# Patient Record
Sex: Female | Born: 1975 | Race: Black or African American | Hispanic: No | Marital: Single | Smoking: Never smoker
Health system: Southern US, Community
[De-identification: ages and names within clinical notes are randomized; demographics above are authoritative.]

## PROBLEM LIST (undated history)

## (undated) DIAGNOSIS — Z789 Other specified health status: Secondary | ICD-10-CM

---

## 2008-01-31 ENCOUNTER — Ambulatory Visit: Payer: Self-pay | Admitting: Family Medicine

## 2008-09-08 ENCOUNTER — Ambulatory Visit: Payer: Self-pay | Admitting: Family Medicine

## 2009-07-14 ENCOUNTER — Ambulatory Visit: Payer: Self-pay | Admitting: Podiatry

## 2015-09-15 ENCOUNTER — Ambulatory Visit
Admission: EM | Admit: 2015-09-15 | Discharge: 2015-09-15 | Disposition: A | Discharge status: Home / Self Care | Payer: BC Managed Care – PPO | Attending: Family Medicine | Admitting: Family Medicine

## 2015-09-15 ENCOUNTER — Encounter: Payer: Self-pay | Admitting: Emergency Medicine

## 2015-09-15 DIAGNOSIS — B998 Other infectious disease: Secondary | ICD-10-CM | POA: Diagnosis not present

## 2015-09-15 DIAGNOSIS — L089 Local infection of the skin and subcutaneous tissue, unspecified: Secondary | ICD-10-CM

## 2015-09-15 MED ORDER — MUPIROCIN 2 % EX OINT: 1.0000 "application " | TOPICAL_OINTMENT | Freq: Three times a day (TID) | CUTANEOUS | Status: DC

## 2015-09-15 MED ORDER — SULFAMETHOXAZOLE-TRIMETHOPRIM 800-160 MG PO TABS: 1.0000 | ORAL_TABLET | Freq: Two times a day (BID) | ORAL | Status: DC

## 2015-09-15 NOTE — ED Notes (Signed)
Pt with swelling in face x 2 days

## 2015-09-15 NOTE — ED Provider Notes (Signed)
CSN: [id number]     Arrival date & time 09/15/15  1742 History   First MD Initiated Contact with Patient 09/15/15 1822     Chief Complaint  Patient presents with  . Facial Pain   (Consider location/radiation/quality/duration/timing/severity/associated sxs/prior Treatment) HPI   39 year old female who is accompanied by her husband playing a swelling on the left side of her nose for 2 days. She states that she had a pimple in her nose shortly preceding the swelling. Now the nose has become very red and tender and hard to the touch. She also complains of a sore throat mostly from postnasal drip. She's had no drainage from her nose. She denies any fever or chills and is afebrile today.  History reviewed. No pertinent past medical history. History reviewed. No pertinent past surgical history. History reviewed. No pertinent family history. Social History  Substance Use Topics  . Smoking status: Never Smoker   . Smokeless tobacco: None  . Alcohol Use: No   OB History    No data available     Review of Systems  Constitutional: Negative for fever, chills and fatigue.  HENT: Positive for postnasal drip and sinus pressure.   Skin: Positive for color change.  All other systems reviewed and are negative.   Allergies  Review of patient's allergies indicates no known allergies.  Home Medications   Prior to Admission medications   Medication Sig Start Date End Date Taking? Authorizing Provider  mupirocin ointment (BACTROBAN) 2 % Apply 1 application topically 3 (three) times daily. 09/15/15   Lutricia FeilWilliam P Bryttney Netzer, PA-C  sulfamethoxazole-trimethoprim (BACTRIM DS,SEPTRA DS) 800-160 MG tablet Take 1 tablet by mouth 2 (two) times daily. 09/15/15   Lutricia FeilWilliam P Xariah Silvernail, PA-C   Meds Ordered and Administered this Visit  Medications - No data to display  BP 112/84 mmHg  Pulse 90  Temp(Src) 98.8 F (37.1 C) (Tympanic)  Resp 20  Ht 5\' 8"  (1.727 m)  Wt 220 lb (99.791 kg)  BMI 33.46 kg/m2  SpO2  99% No data found.   Physical Exam  Constitutional: She is oriented to person, place, and time. She appears well-developed and well-nourished. No distress.  HENT:  Head: Normocephalic and atraumatic.  Right Ear: External ear normal.  Left Ear: External ear normal.  Examination of the patient's face shows a swelling erythema induration and tenderness in from the bridge of the nose over the left labial and extending slightly into the face. Examination intranasally shows a swelling and erythema of the inferior turbinates. There is no discharge from the nares. There is no cervical adenopathy present.  Eyes: Pupils are equal, round, and reactive to light.  Neck: Neck supple.  Musculoskeletal: Normal range of motion. She exhibits no edema or tenderness.  Neurological: She is alert and oriented to person, place, and time.  Skin: Skin is warm and dry. She is not diaphoretic. There is erythema.  Psychiatric: She has a normal mood and affect. Her behavior is normal. Judgment and thought content normal.  Nursing note and vitals reviewed.   ED Course  Procedures (including critical care time)  Labs Review Labs Reviewed - No data to display  Imaging Review No results found.   Visual Acuity Review  Right Eye Distance:   Left Eye Distance:   Bilateral Distance:    Right Eye Near:   Left Eye Near:    Bilateral Near:         MDM   1. Facial infection    Discharge Medication List as  of 09/15/2015  6:43 PM    START taking these medications   Details  mupirocin ointment (BACTROBAN) 2 % Apply 1 application topically 3 (three) times daily., Starting 09/15/2015, Until Discontinued, Print    sulfamethoxazole-trimethoprim (BACTRIM DS,SEPTRA DS) 800-160 MG tablet Take 1 tablet by mouth 2 (two) times daily., Starting 09/15/2015, Until Discontinued, Print       Plan: 1.Diagnosis reviewed with patient 2. rx as per orders; risks, benefits, potential side effects reviewed with  patient 3. Recommend supportive treatment with warm compresses QID followed by application of bactroban UGT 4. F/u  48 hours for  Wound check  Lutricia Feil, PA-C 09/16/15 1029

## 2015-09-15 NOTE — Discharge Instructions (Signed)
Hand Washing Germs like bacteria, viruses, and parasites are found everywhere. They can be in the air and water, and they can be on surfaces like food, door handles, and your skin. Every day, your hands come into contact with germs, many of which can make you and your family sick. Washing your hands is one of the easiest and most effective ways to reduce your risk of contracting and sharing germs. WHEN SHOULD I WASH MY HANDS? You should wash your hands whenever you think they are dirty. You should also wash your hands:  Before:  Visiting a baby or anyone with a weakened or lowered defense (immune) system.  Putting in and taking out any contact lenses.  After:  Working or playing outside.  Touching an animal or its toys or leash.  Handling livestock.  Using the bathroom.  Using household cleaners or toxic chemicals.  Touching or taking out the garbage.  Touching anything dirty around your home.  Handling soiled clothes or rags.  Taking care of a sick child. This includes touching used tissues, toys, and clothes.  Sneezing, coughing, or blowing your nose.  Using public transportation.  Shaking hands.  Using a phone, including your mobile phone.  Touching money.  Before and after:  Preparing food.  Preparing a bottle for a baby.  Feeding a baby or young child.  Eating.  Visiting or taking care of someone who is sick.  Changing a diaper.  Changing a bandage (dressing) or taking care of an injury or wound.  Giving or taking medicine. WHAT IS THE CORRECT WAY TO [facility] MY [facility]?   Wet your hands with clean, running water.  Apply liquid soap or bar soap to your hands.  Rub your hands together quickly to create lather.  Keep rubbing your hands together for at least 20 seconds. Thoroughly scrub all parts of your hands, including under your fingernails and between your fingers.  Rinse your hands with clean, running water until all the soap is gone.  Dry your  hands using an air dryer or a clean paper or cloth towel, or let your hands air-dry. Do not use your clothing or a soiled towel to dry your hands.  If you are in a public restroom, use your towel:  To turn off the water faucet.  To open the bathroom door. HOW CAN I CLEAN MY HANDS IF I DO NOT HAVE SOAP AND WATER? If soap and clean water are not available, use an alcohol-based wipe, spray, or hand gel. Use a hand-sanitizing agent that contains at least 60% alcohol. If you are preparing food, hand sanitizers are not recommended as a substitute for hand washing. To use a hand sanitizer, follow the directions provided on the product, and:  Apply an adequate amount of the product to your hands.  Make sure you wipe, rub, or spray the product so that it reaches every part of your hands and wrists. Include the backs of your hands, between your fingers, and under your fingernails.  Rub the product onto your hands until it dries.   This information is not intended to replace advice given to you by your health care provider. Make sure you discuss any questions you have with your health care provider.   Document Released: 07/10/2005 Document Revised: 12/10/2014 Document Reviewed: 04/16/2014 Elsevier Interactive Patient Education [employer]2016 [employer]

## 2015-12-05 ENCOUNTER — Encounter: Payer: Self-pay | Admitting: *Deleted

## 2015-12-05 ENCOUNTER — Ambulatory Visit
Admission: EM | Admit: 2015-12-05 | Discharge: 2015-12-05 | Disposition: A | Discharge status: Home / Self Care | Payer: BC Managed Care – PPO | Attending: Family Medicine | Admitting: Family Medicine

## 2015-12-05 DIAGNOSIS — R05 Cough: Secondary | ICD-10-CM | POA: Diagnosis not present

## 2015-12-05 DIAGNOSIS — H6501 Acute serous otitis media, right ear: Secondary | ICD-10-CM | POA: Diagnosis not present

## 2015-12-05 DIAGNOSIS — R059 Cough, unspecified: Secondary | ICD-10-CM

## 2015-12-05 MED ORDER — BENZONATATE 200 MG PO CAPS
200.0000 mg | ORAL_CAPSULE | Freq: Three times a day (TID) | ORAL | Status: DC | PRN
Start: 2015-12-05 — End: 2018-04-29

## 2015-12-05 MED ORDER — AMOXICILLIN 875 MG PO TABS: 875.0000 mg | ORAL_TABLET | Freq: Two times a day (BID) | ORAL | Status: DC

## 2015-12-05 NOTE — ED Provider Notes (Signed)
CSN: [id number]     Arrival date & time 12/05/15  1000 History   First MD Initiated Contact with Patient 12/05/15 1221     Chief Complaint  Patient presents with  . Nasal Congestion  . Generalized Body Aches  . Otalgia    right side  . Cough   (Consider location/radiation/quality/duration/timing/severity/associated sxs/prior Treatment) Patient is a 40 y.o. female presenting with URI. The history is provided by the patient.  URI Presenting symptoms: congestion, cough, ear pain, fever and rhinorrhea   Severity:  Moderate Onset quality:  Sudden Duration:  14 days Timing:  Constant Progression:  Worsening Chronicity:  New Relieved by:  Nothing Ineffective treatments:  OTC medications Associated symptoms: headaches   Associated symptoms: no neck pain and no wheezing     History reviewed. No pertinent past medical history. History reviewed. No pertinent past surgical history. History reviewed. No pertinent family history. Social History  Substance Use Topics  . Smoking status: Never Smoker   . Smokeless tobacco: Never Used  . Alcohol Use: No   OB History    No data available     Review of Systems  Constitutional: Positive for fever.  HENT: Positive for congestion, ear pain and rhinorrhea.   Respiratory: Positive for cough. Negative for wheezing.   Musculoskeletal: Negative for neck pain.  Neurological: Positive for headaches.    Allergies  Review of patient's allergies indicates no known allergies.  Home Medications   Prior to Admission medications   Medication Sig Start Date End Date Taking? Authorizing Provider  amoxicillin (AMOXIL) 875 MG tablet Take 1 tablet (875 mg total) by mouth 2 (two) times daily. 12/05/15   Payton Mccallum, MD  benzonatate (TESSALON) 200 MG capsule Take 1 capsule (200 mg total) by mouth 3 (three) times daily as needed for cough. 12/05/15   Payton Mccallum, MD  mupirocin ointment (BACTROBAN) 2 % Apply 1 application topically 3 (three) times daily.  09/15/15   Lutricia Feil, PA-C  Sibutramine HCl Monohydrate (MERIDIA PO) Take by mouth.    Historical Provider, MD  sulfamethoxazole-trimethoprim (BACTRIM DS,SEPTRA DS) 800-160 MG tablet Take 1 tablet by mouth 2 (two) times daily. 09/15/15   Lutricia Feil, PA-C   Meds Ordered and Administered this Visit  Medications - No data to display  BP 127/81 mmHg  Pulse 83  Temp(Src) 98.2 F (36.8 C) (Oral)  Resp 20  Ht 5\' 7"  (1.702 m)  Wt 218 lb (98.884 kg)  BMI 34.14 kg/m2  SpO2 100%  LMP 11/30/2015 No data found.   Physical Exam  Constitutional: She appears well-developed and well-nourished. No distress.  HENT:  Head: Normocephalic and atraumatic.  Right Ear: External ear and ear canal normal. Tympanic membrane is erythematous. A middle ear effusion is present.  Left Ear: Tympanic membrane, external ear and ear canal normal.  Nose: Mucosal edema and rhinorrhea present. No nose lacerations, sinus tenderness, nasal deformity, septal deviation or nasal septal hematoma. No epistaxis.  No foreign bodies.  Mouth/Throat: Uvula is midline, oropharynx is clear and moist and mucous membranes are normal. No oropharyngeal exudate.  Eyes: Conjunctivae and EOM are normal. Pupils are equal, round, and reactive to light. Right eye exhibits no discharge. Left eye exhibits no discharge. No scleral icterus.  Neck: Normal range of motion. Neck supple. No thyromegaly present.  Cardiovascular: Normal rate, regular rhythm and normal heart sounds.   Pulmonary/Chest: Effort normal and breath sounds normal. No respiratory distress. She has no wheezes. She has no rales.  Lymphadenopathy:  She has no cervical adenopathy.  Skin: She is not diaphoretic.  Nursing note and vitals reviewed.   ED Course  Procedures (including critical care time)  Labs Review Labs Reviewed - No data to display  Imaging Review No results found.   Visual Acuity Review  Right Eye Distance:   Left Eye Distance:    Bilateral Distance:    Right Eye Near:   Left Eye Near:    Bilateral Near:         MDM   1. Right acute serous otitis media, recurrence not specified   2. Cough    Discharge Medication List as of 12/05/2015  1:04 PM    START taking these medications   Details  amoxicillin (AMOXIL) 875 MG tablet Take 1 tablet (875 mg total) by mouth 2 (two) times daily., Starting 12/05/2015, Until Discontinued, Normal    benzonatate (TESSALON) 200 MG capsule Take 1 capsule (200 mg total) by mouth 3 (three) times daily as needed for cough., Starting 12/05/2015, Until Discontinued, Normal       1. diagnosis reviewed with patient 2. rx as per orders above; reviewed possible side effects, interactions, risks and benefits  3. Recommend supportive treatment with otc analgesics prn 4. Follow-up prn if symptoms worsen or don't improve    Payton Mccallumrlando Jonette Wassel, MD 12/05/15 1406

## 2015-12-05 NOTE — ED Notes (Signed)
Patient started having symptoms of nasal congestion and body aches on 11/22/15. Additional symptoms of cough and right ear pain started 2 days ago.

## 2016-03-28 ENCOUNTER — Ambulatory Visit
Admission: EM | Admit: 2016-03-28 | Discharge: 2016-03-28 | Disposition: A | Discharge status: Home / Self Care | Payer: BC Managed Care – PPO | Attending: Family Medicine | Admitting: Family Medicine

## 2016-03-28 DIAGNOSIS — H109 Unspecified conjunctivitis: Secondary | ICD-10-CM

## 2016-03-28 DIAGNOSIS — L089 Local infection of the skin and subcutaneous tissue, unspecified: Secondary | ICD-10-CM | POA: Diagnosis not present

## 2016-03-28 HISTORY — DX: Other specified health status: Z78.9

## 2016-03-28 MED ORDER — SULFAMETHOXAZOLE-TRIMETHOPRIM 800-160 MG PO TABS: 1.0000 | ORAL_TABLET | Freq: Two times a day (BID) | ORAL | Status: AC

## 2016-03-28 MED ORDER — POLYMYXIN B-TRIMETHOPRIM 10000-0.1 UNIT/ML-% OP SOLN: 1.0000 [drp] | Freq: Four times a day (QID) | OPHTHALMIC | Status: AC

## 2016-03-28 NOTE — ED Provider Notes (Signed)
CSN: [id number]     Arrival date & time 03/28/16  1455 History   First MD Initiated Contact with Patient 03/28/16 1516     Chief Complaint  Patient presents with  . Facial Swelling    Pt with swelling left side of nasal/facial area. Eyes have been watering. Was treated for same back in October with an antibiotic. Denies pain   (Consider location/radiation/quality/duration/timing/severity/associated sxs/prior Treatment) HPI : Patient presents today with symptoms of left facial swelling (small area along nose) and tenderness. Patient states that left eye also have been watering and matty. She denies any nasal congestion, sore throat, fever, headache, vision problems. She states that she had a similar issue last year. Patient does wear makeup. She denies wearing contact lenses. Patient has Mirena.    Past Medical History  Diagnosis Date  . Patient denies medical problems    History reviewed. No pertinent past surgical history. History reviewed. No pertinent family history. Social History  Substance Use Topics  . Smoking status: Never Smoker   . Smokeless tobacco: Never Used  . Alcohol Use: No   OB History    No data available     Review of Systems: Negative except mentioned above.   Allergies  Review of patient's allergies indicates no known allergies.  Home Medications   Prior to Admission medications   Medication Sig Start Date End Date Taking? Authorizing Provider  amoxicillin (AMOXIL) 875 MG tablet Take 1 tablet (875 mg total) by mouth 2 (two) times daily. 12/05/15   Payton Mccallumrlando Conty, MD  benzonatate (TESSALON) 200 MG capsule Take 1 capsule (200 mg total) by mouth 3 (three) times daily as needed for cough. 12/05/15   Payton Mccallumrlando Conty, MD  mupirocin ointment (BACTROBAN) 2 % Apply 1 application topically 3 (three) times daily. 09/15/15   Lutricia FeilWilliam P Roemer, PA-C  Sibutramine HCl Monohydrate (MERIDIA PO) Take by mouth.    Historical Provider, MD  sulfamethoxazole-trimethoprim (BACTRIM  DS,SEPTRA DS) 800-160 MG tablet Take 1 tablet by mouth 2 (two) times daily. 09/15/15   Lutricia FeilWilliam P Roemer, PA-C   Meds Ordered and Administered this Visit  Medications - No data to display  BP 111/74 mmHg  Pulse 85  Temp(Src) 98.2 F (36.8 C) (Oral)  Resp 18  Ht 5\' 7"  (1.702 m)  Wt 205 lb (92.987 kg)  BMI 32.10 kg/m2  SpO2 100%  LMP 01/29/2016 No data found.   Physical Exam:  GENERAL: NAD HEENT: no pharyngeal erythema, no exudate, no erythema of TMs, no cervical LAD, PERRL, EOMI, minimal erythema of left conjunctiva, no discharge from the eyes appreciated RESP: CTA B CARD: RRR SKIN: There is a small raised area of erythema and swelling along the left side of the nose, no fluctuance of the area, no maxillary sinus tenderness or erythema, NEURO: CN II-XII grossly intact    ED Course  Procedures (including critical care time)  Labs Review Labs Reviewed - No data to display  Imaging Review No results found.     MDM  A/P:  Cyst/Abscess, Left Conjunctivitis- discussed with patient to use antibacterial soap and water to remove the makeup she wears nightly and also asked her to dispose of any old brushes or sponges she uses on her face, will treat with Bactrim DS, Polytrim ophthalmic, if her symptoms do persist or worsen I do recommend that she seek medical attention as discussed.    Jolene ProvostKirtida Geneal Huebert, MD 03/28/16 [phone]

## 2018-04-29 ENCOUNTER — Ambulatory Visit
Admission: EM | Admit: 2018-04-29 | Discharge: 2018-04-29 | Disposition: A | Discharge status: Home / Self Care | Payer: BC Managed Care – PPO

## 2018-04-29 DIAGNOSIS — W57XXXA Bitten or stung by nonvenomous insect and other nonvenomous arthropods, initial encounter: Secondary | ICD-10-CM

## 2018-04-29 DIAGNOSIS — S70361A Insect bite (nonvenomous), right thigh, initial encounter: Secondary | ICD-10-CM | POA: Diagnosis not present

## 2018-04-29 DIAGNOSIS — L03115 Cellulitis of right lower limb: Secondary | ICD-10-CM | POA: Diagnosis not present

## 2018-04-29 MED ORDER — SULFAMETHOXAZOLE-TRIMETHOPRIM 800-160 MG PO TABS: 1.0000 | ORAL_TABLET | Freq: Two times a day (BID) | ORAL | 0 refills | Status: AC

## 2018-04-29 MED ORDER — DEXAMETHASONE SODIUM PHOSPHATE 10 MG/ML IJ SOLN
10.0000 mg | Freq: Once | INTRAMUSCULAR | Status: AC
Start: 2018-04-29 — End: 2018-04-29
  Administered 2018-04-29: 10 mg via INTRAMUSCULAR

## 2018-04-29 MED ORDER — PREDNISONE 20 MG PO TABS: 20.0000 mg | ORAL_TABLET | Freq: Every day | ORAL | 0 refills | Status: AC

## 2018-04-29 NOTE — Discharge Instructions (Addendum)
Take antibiotics and steroids as prescribed.  May take Benadryl 25 mg every 6 hours as needed for itching as well as hydrocortisone cream. topically  The redness should improve over the next 24 to 48 hours.  Follow-up immediately with your primary care provider should the redness begins to worsen, fevers, nausea, vomiting or any other concerning symptoms.

## 2018-04-29 NOTE — ED Triage Notes (Signed)
Pt was walking on Thursday and has a bite on inside of her right thigh and unsure what bit her but did take a benadryl that day but hasn't stopped itching since. Also has another bite on the right side of her leg. Did apply calamine lotion with a small amount a relief.

## 2018-04-29 NOTE — ED Provider Notes (Signed)
[facility]    CSN: [id number] Arrival date & time: 04/29/18  1606     History   Chief Complaint Chief Complaint  Patient presents with  . Insect Bite    HPI Marissa Zhang is a 42 y.o. female.   Subjective:   Marissa Zhang is a 42 y.o. female who presents for evaluation of a possible skin infection located on the right thigh. Onset of symptoms was 5 days ago with stable course since that time. Symptoms include severe itching and redness. Patient denies any pain, chills, fevers, nausea or vomiting.  Patient reports that she was more than likely bit by an insect the night prior to symptom onset.  The redness has not worsened but not improved either. Treatment to date has included bendaryl with minimal relief.  The following portions of the patient's history were reviewed and updated as appropriate: allergies, current medications, past family history, past medical history, past social history, past surgical history and problem list.       Past Medical History:  Diagnosis Date  . Patient denies medical problems     There are no active problems to display for this patient.   History reviewed. No pertinent surgical history.  OB History   None      Home Medications    Prior to Admission medications   Medication Sig Start Date End Date Taking? Authorizing Provider  levonorgestrel (MIRENA) 20 MCG/24HR IUD 1 each by Intrauterine route once.   Yes [provider]  predniSONE (DELTASONE) 20 MG tablet Take 1 tablet (20 mg total) by mouth daily for 5 days. 04/29/18 05/04/18  Lurline Idol, FNP  sulfamethoxazole-trimethoprim (BACTRIM DS,SEPTRA DS) 800-160 MG tablet Take 1 tablet by mouth 2 (two) times daily for 7 days. 04/29/18 05/06/18  Lurline Idol, FNP    Family History No family history on file.  Social History Social History   Tobacco Use  . Smoking status: Never Smoker  . Smokeless tobacco: Never Used  Substance Use Topics  .  Alcohol use: No  . Drug use: No     Allergies   Patient has no known allergies.   Review of Systems Review of Systems  Constitutional: Negative for fever.  Skin:       Insect bite to the inner right thigh with surrounding redness and itching   All other systems reviewed and are negative.    Physical Exam Triage Vital Signs ED Triage Vitals  Enc Vitals Group     BP 04/29/18 1653 (!) 144/96     Pulse Rate 04/29/18 1653 94     Resp 04/29/18 1653 18     Temp 04/29/18 1653 98.2 F (36.8 C)     Temp Source 04/29/18 1653 Oral     SpO2 04/29/18 1653 100 %     Weight --      Height --      Head Circumference --      Peak Flow --      Pain Score 04/29/18 1655 0     Pain Loc --      Pain Edu? --      Excl. in GC? --    No data found.  Updated Vital Signs BP (!) 144/96 (BP Location: Left Arm)   Pulse 94   Temp 98.2 F (36.8 C) (Oral)   Resp 18   LMP  (LMP Unknown)   SpO2 100%   Visual Acuity Right Eye Distance:   Left Eye Distance:  Bilateral Distance:    Right Eye Near:   Left Eye Near:    Bilateral Near:     Physical Exam  Constitutional: She is oriented to person, place, and time. She appears well-developed and well-nourished.  Neck: Normal range of motion. Neck supple.  Cardiovascular: Normal rate and regular rhythm.  Pulmonary/Chest: Effort normal and breath sounds normal.  Musculoskeletal: Normal range of motion.  Neurological: She is alert and oriented to person, place, and time.  Skin:  Insect bite noted to the inner right thigh with extending redness without any streaking.  No open lesions or drainage noted.  Skin temperature normal.   Psychiatric: She has a normal mood and affect.     UC Treatments / Results  Labs (all labs ordered are listed, but only abnormal results are displayed) Labs Reviewed - No data to display  EKG None  Radiology No results found.  Procedures Procedures (including critical care time)  Medications Ordered in  UC Medications  dexamethasone (DECADRON) injection 10 mg (has no administration in time range)    Initial Impression / Assessment and Plan / UC Course  I have reviewed the triage vital signs and the nursing notes.  Pertinent labs & imaging results that were available during my care of the patient were reviewed by me and considered in my medical decision making (see chart for details).     42 year old female with a probable cellulitis to the right inner thigh secondary to insect bite.  Itching and redness present.  No fevers.  Patient is nontoxic-appearing.  Vital signs stable.  Afebrile.   Plan:  Take antibiotics and steroids as prescribed.  May take Benadryl 25 mg every 6 hours as needed for itching as well as hydrocortisone cream. topically  The redness should improve over the next 24 to 48 hours.  Follow-up immediately with your primary care provider should the redness begins to worsen, fevers, nausea, vomiting or any other concerning symptoms.   Evaluation has revealed no signs of a dangerous process. Discussed diagnosis with patient. Patient aware of their illness, possible red flag symptoms to watch out for and need for close follow up. Patient understands verbal and written discharge instructions. Patient comfortable with plan and disposition.  Patient a clear mental status at this time, good insight into illness (after discussion and teaching) and has clear judgment to make decisions regarding their care.  Documentation was completed with the aid of voice recognition software. Transcription may contain typographical errors.  Final Clinical Impressions(s) / UC Diagnoses   Final diagnoses:  Cellulitis of right lower extremity  Insect bite of right thigh, initial encounter     Discharge Instructions     Take antibiotics and steroids as prescribed.  May take Benadryl 25 mg every 6 hours as needed for itching as well as hydrocortisone cream. topically  The redness should improve  over the next 24 to 48 hours.  Follow-up immediately with your primary care provider should the redness begins to worsen, fevers, nausea, vomiting or any other concerning symptoms.   ED Prescriptions    Medication Sig Dispense Auth. Provider   predniSONE (DELTASONE) 20 MG tablet Take 1 tablet (20 mg total) by mouth daily for 5 days. 5 tablet Lurline Idol, FNP   sulfamethoxazole-trimethoprim (BACTRIM DS,SEPTRA DS) 800-160 MG tablet Take 1 tablet by mouth 2 (two) times daily for 7 days. 14 tablet Lurline Idol, FNP     Controlled Substance Prescriptions [place] Controlled Substance Registry consulted? Not Applicable   Lurline Idol, FNP  04/29/18 1805  

## 2018-08-08 ENCOUNTER — Emergency Department: Payer: BC Managed Care – PPO

## 2018-08-08 ENCOUNTER — Other Ambulatory Visit: Payer: Self-pay

## 2018-08-08 ENCOUNTER — Emergency Department
Admission: EM | Admit: 2018-08-08 | Discharge: 2018-08-08 | Disposition: A | Discharge status: Home / Self Care | Payer: BC Managed Care – PPO | Attending: Emergency Medicine | Admitting: Emergency Medicine

## 2018-08-08 ENCOUNTER — Encounter: Payer: Self-pay | Admitting: Emergency Medicine

## 2018-08-08 DIAGNOSIS — Y92410 Unspecified street and highway as the place of occurrence of the external cause: Secondary | ICD-10-CM | POA: Insufficient documentation

## 2018-08-08 DIAGNOSIS — S0081XA Abrasion of other part of head, initial encounter: Secondary | ICD-10-CM | POA: Insufficient documentation

## 2018-08-08 DIAGNOSIS — Y9389 Activity, other specified: Secondary | ICD-10-CM | POA: Diagnosis not present

## 2018-08-08 DIAGNOSIS — Y999 Unspecified external cause status: Secondary | ICD-10-CM | POA: Insufficient documentation

## 2018-08-08 DIAGNOSIS — S0993XA Unspecified injury of face, initial encounter: Secondary | ICD-10-CM | POA: Diagnosis present

## 2018-08-08 MED ORDER — CYCLOBENZAPRINE HCL 5 MG PO TABS: 5.0000 mg | ORAL_TABLET | Freq: Three times a day (TID) | ORAL | 0 refills | Status: AC | PRN

## 2018-08-08 MED ORDER — KETOROLAC TROMETHAMINE 30 MG/ML IJ SOLN
30.0000 mg | Freq: Once | INTRAMUSCULAR | Status: AC
  Administered 2018-08-08: 30 mg via INTRAMUSCULAR
  Filled 2018-08-08: qty 1

## 2018-08-08 MED ORDER — MELOXICAM 15 MG PO TABS: 15.0000 mg | ORAL_TABLET | Freq: Every day | ORAL | 1 refills | Status: AC
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
